# Patient Record
Sex: Male | Born: 1960 | Race: White | Hispanic: No | Marital: Married | Smoking: Never smoker
Health system: Southern US, Community
[De-identification: ages and names within clinical notes are randomized; demographics above are authoritative.]

## PROBLEM LIST (undated history)

## (undated) DIAGNOSIS — K219 Gastro-esophageal reflux disease without esophagitis: Secondary | ICD-10-CM

## (undated) DIAGNOSIS — F319 Bipolar disorder, unspecified: Secondary | ICD-10-CM

## (undated) DIAGNOSIS — I10 Essential (primary) hypertension: Secondary | ICD-10-CM

---

## 2013-02-15 DIAGNOSIS — K219 Gastro-esophageal reflux disease without esophagitis: Secondary | ICD-10-CM | POA: Insufficient documentation

## 2013-09-09 DIAGNOSIS — I10 Essential (primary) hypertension: Secondary | ICD-10-CM | POA: Insufficient documentation

## 2013-09-09 DIAGNOSIS — F3181 Bipolar II disorder: Secondary | ICD-10-CM | POA: Insufficient documentation

## 2013-12-14 DIAGNOSIS — J4540 Moderate persistent asthma, uncomplicated: Secondary | ICD-10-CM | POA: Insufficient documentation

## 2014-04-23 DIAGNOSIS — H1010 Acute atopic conjunctivitis, unspecified eye: Secondary | ICD-10-CM | POA: Insufficient documentation

## 2015-01-23 DIAGNOSIS — F419 Anxiety disorder, unspecified: Secondary | ICD-10-CM | POA: Insufficient documentation

## 2015-01-23 DIAGNOSIS — G43909 Migraine, unspecified, not intractable, without status migrainosus: Secondary | ICD-10-CM | POA: Insufficient documentation

## 2016-02-27 DIAGNOSIS — F4001 Agoraphobia with panic disorder: Secondary | ICD-10-CM | POA: Insufficient documentation

## 2016-02-27 DIAGNOSIS — F4010 Social phobia, unspecified: Secondary | ICD-10-CM | POA: Insufficient documentation

## 2017-07-22 DIAGNOSIS — D508 Other iron deficiency anemias: Secondary | ICD-10-CM | POA: Insufficient documentation

## 2019-02-07 DIAGNOSIS — R7303 Prediabetes: Secondary | ICD-10-CM | POA: Insufficient documentation

## 2019-11-29 DIAGNOSIS — R200 Anesthesia of skin: Secondary | ICD-10-CM | POA: Insufficient documentation

## 2019-11-29 DIAGNOSIS — M542 Cervicalgia: Secondary | ICD-10-CM | POA: Insufficient documentation

## 2019-12-31 ENCOUNTER — Other Ambulatory Visit: Payer: Self-pay

## 2019-12-31 ENCOUNTER — Emergency Department
Admission: EM | Admit: 2019-12-31 | Discharge: 2019-12-31 | Disposition: A | Discharge status: Home / Self Care | Payer: Medicare Other | Source: Home / Self Care | Attending: Family Medicine | Admitting: Family Medicine

## 2019-12-31 ENCOUNTER — Encounter: Payer: Self-pay | Admitting: Emergency Medicine

## 2019-12-31 DIAGNOSIS — R1032 Left lower quadrant pain: Secondary | ICD-10-CM | POA: Diagnosis not present

## 2019-12-31 DIAGNOSIS — K5900 Constipation, unspecified: Secondary | ICD-10-CM

## 2019-12-31 HISTORY — DX: Gastro-esophageal reflux disease without esophagitis: K21.9

## 2019-12-31 HISTORY — DX: Bipolar disorder, unspecified: F31.9

## 2019-12-31 LAB — POCT URINALYSIS DIP (MANUAL ENTRY)
Bilirubin, UA: NEGATIVE
Blood, UA: NEGATIVE
Glucose, UA: NEGATIVE mg/dL
Ketones, POC UA: NEGATIVE mg/dL
Leukocytes, UA: NEGATIVE
Nitrite, UA: NEGATIVE
Protein Ur, POC: NEGATIVE mg/dL
Spec Grav, UA: 1.01 (ref 1.010–1.025)
Urobilinogen, UA: 0.2 E.U./dL
pH, UA: 6.5 (ref 5.0–8.0)

## 2019-12-31 NOTE — Discharge Instructions (Addendum)
Recommend taking daily Miralax, beginning with 1/2 capful mixed in 4 to 8 ounces of water once daily.  If tolerated, increase to a whole capful of Miralax mixed with 8 ounces water.  Continue until bowel movements are regular. Recommend increasing natural fiber in diet.  May also take a daily fiber product such as Citrucel with plenty of fluid.   If symptoms become significantly worse during the night or over the weekend, proceed to the local emergency room.

## 2019-12-31 NOTE — ED Triage Notes (Signed)
Reports onset of flank pain 3 days ago that now is bilateral low back pain. Wonders if it is UTI versus muscular pain. Has had influenza vacc this season. No known exposure to covid positive person.

## 2019-12-31 NOTE — ED Provider Notes (Signed)
[facility]    CSN: [id number] Arrival date & time: 12/31/19  1549      History   Chief Complaint Chief Complaint  Patient presents with  . Back Pain    low    HPI Gary Park is a 59 y.o. male.   Patient complains of onset of lower back and flank pain 3 days ago.  He has had increased urinary frequency but no dysuria.  He then developed mild nausea without vomiting.  Today he developed vague colicky lower abdominal pain with cramping and increased gas.  He states that his last bowel movement two days ago was smaller than usual.  He denies fevers, chills, and sweats. He reports that he had a large colonic polyp removed in June or July last year.  A repeat colonoscopy was negative.  He has a family history of constipation (mother and brother).  The history is provided by the patient.    Past Medical History:  Diagnosis Date  . Bipolar 1 disorder ([place])   . GERD (gastroesophageal reflux disease)    Current problems and past surgical history reviewed:  No changes        Home Medications    Prior to Admission medications   Medication Sig Start Date End Date Taking? Authorizing Provider  ALPRAZolam Duanne Moron) 0.5 MG tablet Take 0.5 mg by mouth at bedtime as needed for anxiety.   Yes [provider]  aspirin 81 MG chewable tablet Chew by mouth daily.   Yes [provider]  buPROPion (WELLBUTRIN XL) 300 MG 24 hr tablet Take 450 mg by mouth daily.   Yes [provider]  lamoTRIgine (LAMICTAL) 100 MG tablet Take 100 mg by mouth daily.   Yes [provider]  pantoprazole (PROTONIX) 40 MG tablet Take 40 mg by mouth daily.   Yes [provider]  risperiDONE (RISPERDAL) 0.25 MG tablet Take 0.25 mg by mouth at bedtime.   Yes [provider]  traZODone (DESYREL) 50 MG tablet Take 50 mg by mouth at bedtime.   Yes [provider]    Family History Family history reviewed:  No changes.  Social History Social  History   Tobacco Use  . Smoking status: Never smoker  Substance Use Topics  . Alcohol use: Not currently  . Drug use: No     Allergies   Patient has no known allergies.   Review of Systems Review of Systems  Constitutional: Positive for activity change, appetite change and fatigue. Negative for chills, diaphoresis and fever.  HENT: Negative.   Eyes: Negative.   Respiratory: Negative.   Cardiovascular: Negative.   Gastrointestinal: Positive for abdominal pain, constipation and nausea. Negative for abdominal distention, anal bleeding, blood in stool, diarrhea and vomiting.  Genitourinary: Positive for frequency. Negative for dysuria, hematuria and urgency.  Musculoskeletal: Positive for back pain.  Skin: Negative for rash.     Physical Exam Triage Vital Signs ED Triage Vitals  Enc Vitals Group     BP 12/31/19 1632 119/83     Pulse Rate 12/31/19 1632 94     Resp 12/31/19 1632 16     Temp 12/31/19 1632 98.2 F (36.8 C)     Temp Source 12/31/19 1632 Oral     SpO2 12/31/19 1632 97 %     Weight 12/31/19 1634 280 lb (127 kg)     Height 12/31/19 1634 5' 9.5" (1.765 m)     Head Circumference --      Peak Flow --  Pain Score 12/31/19 1633 3     Pain Loc --      Pain Edu? --      Excl. in GC? --    No data found.  Updated Vital Signs BP 119/83 (BP Location: Right Arm)   Pulse 94   Temp 98.2 F (36.8 C) (Oral)   Resp 16   Ht 5' 9.5" (1.765 m)   Wt 127 kg   SpO2 97%   BMI 40.76 kg/m   Visual Acuity Right Eye Distance:   Left Eye Distance:   Bilateral Distance:    Right Eye Near:   Left Eye Near:    Bilateral Near:     Physical Exam Nursing notes and Vital Signs reviewed. Appearance:  Patient appears stated age, and in no acute distress.    Eyes:  Pupils are equal, round, and reactive to light and accomodation.  Extraocular movement is intact.  Conjunctivae are not inflamed   Pharynx:  Normal; moist mucous membranes  Neck:  Supple.  No  adenopathy Lungs:  Clear to auscultation.  Breath sounds are equal.  Moving air well. Heart:  Regular rate and rhythm without murmurs, rubs, or gallops.  Abdomen:  Vague tenderness over lower quadrants without masses or hepatosplenomegaly.  Bowel sounds are present.  No CVA or flank tenderness.  Extremities:  No edema.  Skin:  No rash present.     UC Treatments / Results  Labs (all labs ordered are listed, but only abnormal results are displayed) Labs Reviewed  POCT URINALYSIS DIP (MANUAL ENTRY) negative POCT CBC:  WBC 8.7 ; LY 40.7; MO 7.7; GR 51.6; Hgb 13.7; Platelets 196     EKG   Radiology DG Abd 1 View  Result Date: 01/01/2020 CLINICAL DATA:  Left lower quadrant abdominal pain, flank pain EXAM: ABDOMEN - 1 VIEW COMPARISON:  None. FINDINGS: The bowel gas pattern is nonobstructive. Moderate volume of stool projects throughout the colon. There is a rounded 12 mm radiodensity projecting over the inferior aspect of the right renal shadow, possibly reflecting nephrolithiasis, although this could also represent superimposed bowel contents given the degree of stool volume. No other significant radiographic abnormality are seen. IMPRESSION: Rounded 12 mm radiodensity projecting over the inferior aspect of the right renal shadow, possibly reflecting nephrolithiasis, although this could also represent superimposed bowel contents given the degree of stool volume. Consider further evaluation with renal ultrasound to assess for obstructive uropathy. Electronically Signed   By: Duanne Guess D.O.   On: 01/01/2020 09:11    Procedures Procedures (including critical care time)  Medications Ordered in UC Medications - No data to display  Initial Impression / Assessment and Plan / UC Course  I have reviewed the triage vital signs and the nursing notes.  Pertinent labs & imaging results that were available during my care of the patient were reviewed by me and considered in my medical decision  making (see chart for details).     Normal CBC and urinalysis reassuring.  Note KUB finding of moderate stool throughout the colon. Suspect constipation as cause of patient's pain. Recommend follow-up with PCP to schedule renal ultrasound to evaluate KUB finding of 47mm radiodensity over inferior aspect of right renal shadow.  Final Clinical Impressions(s) / UC Diagnoses   Final diagnoses:  Abdominal pain, left lower quadrant  Constipation, unspecified constipation type     Discharge Instructions     Recommend taking daily Miralax, beginning with 1/2 capful mixed in 4 to 8 ounces of water once  daily.  If tolerated, increase to a whole capful of Miralax mixed with 8 ounces water.  Continue until bowel movements are regular. Recommend increasing natural fiber in diet.  May also take a daily fiber product such as Citrucel with plenty of fluid.   If symptoms become significantly worse during the night or over the weekend, proceed to the local emergency room.     ED Prescriptions    None        Lattie Haw, MD 01/03/20 [phone]

## 2020-01-01 ENCOUNTER — Ambulatory Visit (INDEPENDENT_AMBULATORY_CARE_PROVIDER_SITE_OTHER): Payer: Medicare Other

## 2020-01-01 ENCOUNTER — Telehealth (INDEPENDENT_AMBULATORY_CARE_PROVIDER_SITE_OTHER): Payer: Medicare Other | Admitting: Family Medicine

## 2020-01-01 DIAGNOSIS — R1032 Left lower quadrant pain: Secondary | ICD-10-CM

## 2020-01-01 LAB — POCT CBC W AUTO DIFF (K'VILLE URGENT CARE)

## 2020-01-01 NOTE — Telephone Encounter (Signed)
Patient returns for follow-up; reports little change this morning. POCT CBC obtained:  WNL.

## 2020-06-19 ENCOUNTER — Encounter: Payer: Self-pay | Admitting: Emergency Medicine

## 2020-06-19 ENCOUNTER — Emergency Department (INDEPENDENT_AMBULATORY_CARE_PROVIDER_SITE_OTHER): Payer: Medicare Other

## 2020-06-19 ENCOUNTER — Other Ambulatory Visit: Payer: Self-pay

## 2020-06-19 ENCOUNTER — Emergency Department
Admission: EM | Admit: 2020-06-19 | Discharge: 2020-06-19 | Disposition: A | Discharge status: Home / Self Care | Payer: Medicare Other | Source: Home / Self Care | Attending: Family Medicine | Admitting: Family Medicine

## 2020-06-19 DIAGNOSIS — M654 Radial styloid tenosynovitis [de Quervain]: Secondary | ICD-10-CM | POA: Diagnosis not present

## 2020-06-19 MED ORDER — PREDNISONE 20 MG PO TABS: ORAL_TABLET | ORAL | 0 refills | Status: DC

## 2020-06-19 NOTE — ED Provider Notes (Signed)
[facility]    CSN: [id number] Arrival date & time: 06/19/20  1218      History   Chief Complaint Chief Complaint  Patient presents with  . Wrist Pain  . Wrist Injury    HPI Tayron Hunnell is a 59 y.o. male.   Patient was lifting furniture four days ago.  The next day he awoke with pain in his left wrist and thumb although he recalls no injury.   The history is provided by the patient.  Wrist Pain This is a new problem. Episode onset: 3 days ago. The problem occurs constantly. The problem has been gradually worsening. Exacerbated by: movement of left thumb. Nothing relieves the symptoms. Treatments tried: ibuprofen and ace wrap. The treatment provided mild relief.    Past Medical History:  Diagnosis Date  . Bipolar 1 disorder ([facility])   . GERD (gastroesophageal reflux disease)     Patient Active Problem List   Diagnosis Date Noted  . Left arm numbness 11/29/2019  . Neck pain 11/29/2019  . Prediabetes 02/07/2019  . Iron deficiency anemia secondary to inadequate dietary iron intake 07/22/2017  . 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 Social anxiety disorder 02/27/2016  . Anxiety 01/23/2015  . Migraine 01/23/2015  . Allergic conjunctivitis 04/23/2014  . Moderate persistent asthma, uncomplicated 12/14/2013  . Bipolar 2 disorder ([facility]) 09/09/2013  . HTN (hypertension) 09/09/2013  . GERD (gastroesophageal reflux disease) 02/15/2013    History reviewed. No pertinent surgical history.     Home Medications    Prior to Admission medications   Medication Sig Start Date End Date Taking? Authorizing Provider  ALPRAZolam Prudy Feeler) 0.5 MG tablet Take 0.5 mg by mouth at bedtime as needed for anxiety.   Yes [provider]  aspirin 81 MG chewable tablet Chew by mouth daily.   Yes [provider]  buPROPion (WELLBUTRIN XL) 300 MG 24 hr tablet Take 450 mg by mouth daily.   Yes [provider]  fluticasone (FLONASE) 50 MCG/ACT nasal spray  Place into the nose. 01/13/14  Yes [provider]  lamoTRIgine (LAMICTAL) 100 MG tablet Take 100 mg by mouth daily.   Yes [provider]  metoprolol succinate (TOPROL-XL) 50 MG 24 hr tablet Take by mouth. 05/07/20  Yes [provider]  Multiple Vitamin (MULTIVITAMIN) tablet Take by mouth.   Yes [provider]  Olopatadine HCl 0.2 % SOLN Apply 1 drop in each eye once daily as needed for eye symptoms. 01/13/14  Yes [provider]  pantoprazole (PROTONIX) 40 MG tablet Take 40 mg by mouth daily.   Yes [provider]  risperiDONE (RISPERDAL) 0.25 MG tablet Take 0.25 mg by mouth at bedtime.   Yes [provider]  traZODone (DESYREL) 50 MG tablet Take 50 mg by mouth at bedtime.   Yes [provider]  predniSONE (DELTASONE) 20 MG tablet Take one tab by mouth twice daily for 4 days, then one daily. Take with food. 06/19/20   Lattie Haw, MD    Family History History reviewed. No pertinent family history.  Social History Social History   Tobacco Use  . Smoking status: Never Smoker  . Smokeless tobacco: Never Used  Vaping Use  . Vaping Use: Never used  Substance Use Topics  . Alcohol use: Not on file  . Drug use: Not on file     Allergies   Bee pollen and Pollen extract   Review of Systems Review of Systems  Constitutional: Negative.  Musculoskeletal: Positive for joint swelling.       Left wrist/thumb pain     Physical Exam Triage Vital Signs ED Triage Vitals  Enc Vitals Group     BP 06/19/20 1254 138/90     Pulse Rate 06/19/20 1254 87     Resp 06/19/20 1254 18     Temp 06/19/20 1254 98.3 F (36.8 C)     Temp Source 06/19/20 1254 Oral     SpO2 06/19/20 1254 97 %     Weight 06/19/20 1257 300 lb (136.1 kg)     Height --      Head Circumference --      Peak Flow --      Pain Score 06/19/20 1256 4     Pain Loc --      Pain Edu? --      Excl. in GC? --    No data found.  Updated Vital  Signs BP 138/90 (BP Location: Right Arm)   Pulse 87   Temp 98.3 F (36.8 C) (Oral)   Resp 18   Wt 136.1 kg   SpO2 97%   BMI 43.67 kg/m   Visual Acuity Right Eye Distance:   Left Eye Distance:   Bilateral Distance:    Right Eye Near:   Left Eye Near:    Bilateral Near:     Physical Exam Vitals and nursing note reviewed.  Constitutional:      General: He is not in acute distress. Eyes:     Pupils: Pupils are equal, round, and reactive to light.  Cardiovascular:     Rate and Rhythm: Normal rate.  Pulmonary:     Effort: Pulmonary effort is normal.  Musculoskeletal:     Left wrist: Tenderness present. No swelling, bony tenderness, snuff box tenderness or crepitus. Normal range of motion. Normal pulse.       Hands:     Cervical back: Normal range of motion.     Comments: Left thumb/wrist dorsally have distinct tenderness over the extensor tendons.  Palpation there during resisted extension and abduction of the thumb recreates his pain.  Distal neurovascular function is intact.    Skin:    General: Skin is warm and dry.  Neurological:     Mental Status: He is alert.      UC Treatments / Results  Labs (all labs ordered are listed, but only abnormal results are displayed) Labs Reviewed - No data to display  EKG   Radiology DG Wrist Complete Left  Result Date: 06/19/2020 CLINICAL DATA:  Pain after moving heavy objects EXAM: LEFT WRIST - COMPLETE 3+ VIEW COMPARISON:  None. FINDINGS: Frontal, oblique, lateral, and ulnar deviation scaphoid images obtained. No fracture or dislocation. Joint spaces appear normal. No erosive change. IMPRESSION: No fracture or dislocation.  No evident arthropathy. Electronically Signed   By: Bretta Bang III M.D.   On: 06/19/2020 13:54    Procedures Procedures (including critical care time)  Medications Ordered in UC Medications - No data to display  Initial Impression / Assessment and Plan / UC Course  I have reviewed the triage  vital signs and the nursing notes.  Pertinent labs & imaging results that were available during my care of the patient were reviewed by me and considered in my medical decision making (see chart for details).    Thumb spica splint dispensed.  Begin prednisone burst/taper. Rx for Lortab #5, no refill  Controlled Substance Prescriptions I have consulted the [place] Controlled Substances Registry for  this patient, and feel the risk/benefit ratio today is favorable for proceeding with this prescription for a controlled substance.   Followup with Dr. Rodney [facility] ([facility]) if not improving about two weeks.  Final Clinical Impressions(s) / UC Diagnoses   Final diagnoses:  Tenosynovitis, de Quervain     Discharge Instructions     Wear wrist splint until improved. Apply ice pack for 20 to 30 minutes, 2 to 3 times daily  Continue until pain and swelling decrease.  May take Tylenol as needed for pain.  Begin range of motion and stretching exercises as tolerated.   ED Prescriptions    Medication Sig Dispense Auth. Provider   predniSONE (DELTASONE) 20 MG tablet Take one tab by mouth twice daily for 4 days, then one daily. Take with food. 12 tablet Lattie Haw, MD        Lattie Haw, MD 06/21/20 [phone]

## 2020-06-19 NOTE — ED Triage Notes (Addendum)
C/O pain to left wrist since Friday morning - pt woke  up w/ pain Swelling noted to L  thumb joint-limited ROM to thumb - swelling and bruising to left hand Pt has been using an ace wrap -provides min relief Ibuprofen OTC - none this am Pt is having difficulty gripping  Pt has been packing, getting ready to place home on the [employer] vaccine in April

## 2020-06-19 NOTE — Discharge Instructions (Addendum)
Wear wrist splint until improved. Apply ice pack for 20 to 30 minutes, 2 to 3 times daily  Continue until pain and swelling decrease.  May take Tylenol as needed for pain.  Begin range of motion and stretching exercises as tolerated.

## 2020-07-06 ENCOUNTER — Ambulatory Visit (INDEPENDENT_AMBULATORY_CARE_PROVIDER_SITE_OTHER): Payer: Medicare Other | Admitting: Sports Medicine

## 2020-07-06 ENCOUNTER — Other Ambulatory Visit: Payer: Self-pay

## 2020-07-06 DIAGNOSIS — M654 Radial styloid tenosynovitis [de Quervain]: Secondary | ICD-10-CM | POA: Diagnosis not present

## 2020-07-06 MED ORDER — TRIAZOLAM 0.25 MG PO TABS: ORAL_TABLET | ORAL | 0 refills | Status: DC

## 2020-07-06 NOTE — Progress Notes (Signed)
    Procedures performed today:    None.  Independent interpretation of notes and tests performed by another provider:   X-rays personally reviewed and are unremarkable.  Brief History, Exam, Impression, and Recommendations:    De Quervain's tenosynovitis, left Kazuo is a very pleasant 59 year old male with history of bipolar disorder and anxiety, he is currently trying to sell his house so has been doing a lot of moving, unfortunately he developed pain over the radial aspect of his left wrist, he was seen in urgent care and given prednisone, thumb spica brace, thumb spica brace helped to some degree but over the past 2 weeks he is continued to have severe pain. Today he has tenderness over the first extensor compartment with a strongly positive Finkelstein sign. The neck step is an injection, he is a bit nervous today so I am happy to call in some triazolam for him to take beforehand, we can do the shot next week.    ___________________________________________ Ihor Austin. Benjamin Stain, M.D., ABFM., CAQSM. Primary Care and Sports Medicine [place] [facility]  Adjunct Instructor of Family Medicine  [employer]

## 2020-07-06 NOTE — Assessment & Plan Note (Signed)
[facility] is a very pleasant 59 year old male with history of bipolar disorder and anxiety, he is currently trying to sell his house so has been doing a lot of moving, unfortunately he developed pain over the radial aspect of his left wrist, he was seen in urgent care and given prednisone, thumb spica brace, thumb spica brace helped to some degree but over the past 2 weeks he is continued to have severe pain. Today he has tenderness over the first extensor compartment with a strongly positive Finkelstein sign. The neck step is an injection, he is a bit nervous today so I am happy to call in some triazolam for him to take beforehand, we can do the shot next week.

## 2020-07-10 ENCOUNTER — Ambulatory Visit (INDEPENDENT_AMBULATORY_CARE_PROVIDER_SITE_OTHER): Payer: Medicare Other | Admitting: Sports Medicine

## 2020-07-10 DIAGNOSIS — M654 Radial styloid tenosynovitis [de Quervain]: Secondary | ICD-10-CM

## 2020-07-10 NOTE — Assessment & Plan Note (Signed)
Edwyn returns, we deferred his injection at the last visit due to needle anxiety. I gave him some triazolam and he feels fairly relaxed, today we performed a first extensor compartment injection with good relief immediately. Continue thumb spica brace for another week, and particularly when moving, otherwise activities as tolerated and may see me in a month.

## 2020-07-10 NOTE — Progress Notes (Signed)
    Procedures performed today:    Procedure: Real-time Ultrasound Guided injection of the left first extensor compartment Device: Samsung HS60  Verbal informed consent obtained.  Time-out conducted.  Noted no overlying erythema, induration, or other signs of local infection.  Skin prepped in a sterile fashion.  Local anesthesia: Topical Ethyl chloride.  With sterile technique and under real time ultrasound guidance: 1 cc Kenalog 40, 1 cc lidocaine, 1 cc bupivacaine injected easily Completed without difficulty  Pain immediately resolved suggesting accurate placement of the medication.  Advised to call if fevers/chills, erythema, induration, drainage, or persistent bleeding.  Images permanently stored and available for review in the ultrasound unit.  Impression: Technically successful ultrasound guided injection.  Independent interpretation of notes and tests performed by another provider:   None.  Brief History, Exam, Impression, and Recommendations:    De Quervain's tenosynovitis, left Puneet returns, we deferred his injection at the last visit due to needle anxiety. I gave him some triazolam and he feels fairly relaxed, today we performed a first extensor compartment injection with good relief immediately. Continue thumb spica brace for another week, and particularly when moving, otherwise activities as tolerated and may see me in a month.    ___________________________________________ Ihor Austin. Benjamin Stain, M.D., ABFM., CAQSM. Primary Care and Sports Medicine  [facility]  Adjunct Instructor of Family Medicine  [employer]

## 2020-08-03 ENCOUNTER — Ambulatory Visit: Payer: Medicare Other | Admitting: Sports Medicine

## 2020-11-24 ENCOUNTER — Emergency Department
Admission: EM | Admit: 2020-11-24 | Discharge: 2020-11-24 | Disposition: A | Discharge status: Home / Self Care | Payer: Medicare Other | Source: Home / Self Care

## 2020-11-24 ENCOUNTER — Other Ambulatory Visit: Payer: Self-pay

## 2020-11-24 DIAGNOSIS — H18822 Corneal disorder due to contact lens, left eye: Secondary | ICD-10-CM

## 2020-11-24 MED ORDER — GENTAMICIN SULFATE 0.3 % OP SOLN: 1.0000 [drp] | OPHTHALMIC | 0 refills | Status: AC

## 2020-11-24 NOTE — ED Triage Notes (Signed)
Pt c/o LT eye irritation x 2 days. Wears contacts daily, extended wear kind. Last time he removed his contact, it felt like it dried to his eye. Eye visibly red, watering, "sand" like feeling.

## 2020-11-24 NOTE — Discharge Instructions (Signed)
  Use the medication as prescribed. Avoid wearing your contacts until follow up with your eye specialist on Monday. Call Monday to schedule that appointment.    Call 911 or have someone drive you to the hospital if symptoms significantly worsening.

## 2020-11-24 NOTE — ED Provider Notes (Addendum)
[facility]    CSN: [id number] Arrival date & time: 11/24/20  1501      History   Chief Complaint Chief Complaint  Patient presents with  . eye irritation    LT    HPI Gary Park is a 59 y.o. male.   HPI  Gary Park is a 59 y.o. male presenting to UC with c/o Left eye irritation that started 2 days ago while wearing extended wear contacts. He changed to a new pair of contacts after leaving the old ones out for several hours. He does sleep in his contacts and has continued to feel irritation. He removed the new contacts earlier today and still has irritation described as "sand" in his eye. His family encouraged him to be evaluated.  Denies fever or chills but has had nasal congestion and mild headache.  States he has bifocals but does not like wearing them as he has not been able to "get use to them."   No sick contacts.    Past Medical History:  Diagnosis Date  . Bipolar 1 disorder (HCC)   . GERD (gastroesophageal reflux disease)     Patient Active Problem List   Diagnosis Date Noted  . De Quervain's tenosynovitis, left 07/06/2020  . Left arm numbness 11/29/2019  . Neck pain 11/29/2019  . Prediabetes 02/07/2019  . Iron deficiency anemia secondary to inadequate dietary iron intake 07/22/2017  . 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 Social anxiety disorder 02/27/2016  . Anxiety 01/23/2015  . Migraine 01/23/2015  . Allergic conjunctivitis 04/23/2014  . Moderate persistent asthma, uncomplicated 12/14/2013  . Bipolar 2 disorder (HCC) 09/09/2013  . HTN (hypertension) 09/09/2013  . GERD (gastroesophageal reflux disease) 02/15/2013    History reviewed. No pertinent surgical history.     Home Medications    Prior to Admission medications   Medication Sig Start Date End Date Taking? Authorizing Provider  ALPRAZolam Prudy Feeler) 0.5 MG tablet Take 0.5 mg by mouth at bedtime as needed for anxiety.    [provider]  aspirin 81 MG chewable  tablet Chew by mouth daily.    [provider]  buPROPion (WELLBUTRIN XL) 300 MG 24 hr tablet Take 450 mg by mouth daily.    [provider]  fluticasone (FLONASE) 50 MCG/ACT nasal spray Place into the nose. 01/13/14   [provider]  gentamicin (GARAMYCIN) 0.3 % ophthalmic solution Place 1 drop into the left eye every 4 (four) hours for 5 days. 11/24/20 11/29/20  Lurene Shadow, PA-C  lamoTRIgine (LAMICTAL) 100 MG tablet Take 100 mg by mouth daily.    [provider]  metoprolol succinate (TOPROL-XL) 50 MG 24 hr tablet Take by mouth. 05/07/20   [provider]  Multiple Vitamin (MULTIVITAMIN) tablet Take by mouth.    [provider]  Olopatadine HCl 0.2 % SOLN Apply 1 drop in each eye once daily as needed for eye symptoms. 01/13/14   [provider]  pantoprazole (PROTONIX) 40 MG tablet Take 40 mg by mouth daily.    [provider]  predniSONE (DELTASONE) 20 MG tablet Take one tab by mouth twice daily for 4 days, then one daily. Take with food. 06/19/20   Lattie Haw, MD  risperiDONE (RISPERDAL) 0.25 MG tablet Take 0.25 mg by mouth at bedtime.    [provider]  traZODone (DESYREL) 50 MG tablet Take 50 mg by mouth at bedtime.    [provider]  triazolam (HALCION) 0.25 MG tablet 1-2 tabs  PO 2 hours before procedure or imaging.  Do not drive with this medication. 07/06/20   Monica Becton, MD    Family History History reviewed. No pertinent family history.  Social History Social History   Tobacco Use  . Smoking status: Never Smoker  . Smokeless tobacco: Never Used  Vaping Use  . Vaping Use: Never used     Allergies   Bee pollen and Pollen extract   Review of Systems Review of Systems  Constitutional: Negative for chills and fever.  HENT: Positive for congestion. Negative for ear pain, rhinorrhea, sinus pressure, sinus pain, sneezing, sore throat and tinnitus.   Eyes: Positive for  pain, redness, itching and visual disturbance (unable to wear his contacts). Negative for photophobia.  Respiratory: Negative for cough.   Neurological: Positive for headaches. Negative for dizziness and light-headedness.     Physical Exam Triage Vital Signs ED Triage Vitals  Enc Vitals Group     BP 11/24/20 1511 (!) 142/86     Pulse Rate 11/24/20 1511 93     Resp 11/24/20 1511 17     Temp 11/24/20 1511 98 F (36.7 C)     Temp Source 11/24/20 1511 Oral     SpO2 11/24/20 1511 99 %     Weight --      Height --      Head Circumference --      Peak Flow --      Pain Score 11/24/20 1514 6     Pain Loc --      Pain Edu? --      Excl. in GC? --    No data found.  Updated Vital Signs BP (!) 142/86 (BP Location: Right Arm)   Pulse 93   Temp 98 F (36.7 C) (Oral)   Resp 17   SpO2 99%   Visual Acuity- without correction in Left eye (injured eye), Contact in Right eye (good eye) Right Eye Distance: 20/30 Left Eye Distance: 20/70 Bilateral Distance: 20/30  Right Eye Near:   Left Eye Near:    Bilateral Near:     Physical Exam Vitals and nursing note reviewed.  Constitutional:      General: He is not in acute distress.    Appearance: Normal appearance. He is well-developed and well-nourished. He is obese. He is not ill-appearing, toxic-appearing or diaphoretic.  HENT:     Head: Normocephalic and atraumatic.     Right Ear: Tympanic membrane and ear canal normal.     Left Ear: Tympanic membrane and ear canal normal.     Nose: Nose normal.     Right Sinus: No maxillary sinus tenderness or frontal sinus tenderness.     Left Sinus: No maxillary sinus tenderness or frontal sinus tenderness.     Mouth/Throat:     Lips: Pink.     Mouth: Mucous membranes are moist.     Pharynx: Oropharynx is clear. Uvula midline.  Eyes:     General: Lids are normal. Lids are everted, no foreign bodies appreciated. Vision grossly intact. Gaze aligned appropriately.        Left eye: No foreign  body or discharge.     Extraocular Movements: Extraocular movements intact and EOM normal.     Conjunctiva/sclera:     Right eye: Right conjunctiva is not injected.     Left eye: Left conjunctiva is injected.   Cardiovascular:     Rate and Rhythm: Normal rate and regular rhythm.  Pulmonary:     Effort:  Pulmonary effort is normal. No respiratory distress.     Breath sounds: Normal breath sounds.  Musculoskeletal:        General: Normal range of motion.     Cervical back: Normal range of motion and neck supple. No rigidity or tenderness.  Lymphadenopathy:     Cervical: No cervical adenopathy.  Skin:    General: Skin is warm and dry.  Neurological:     Mental Status: He is alert and oriented to person, place, and time.  Psychiatric:        Mood and Affect: Mood and affect normal.        Behavior: Behavior normal.      UC Treatments / Results  Labs (all labs ordered are listed, but only abnormal results are displayed) Labs Reviewed - No data to display  EKG   Radiology No results found.  Procedures Procedures (including critical care time)  Medications Ordered in UC Medications - No data to display  Initial Impression / Assessment and Plan / UC Course  I have reviewed the triage vital signs and the nursing notes.  Pertinent labs & imaging results that were available during my care of the patient were reviewed by me and considered in my medical decision making (see chart for details).    Hx and exam c/w corneal abrasion of Left eye from contact lenses Encouraged f/u with his eye specialist Monday for recheck of symptoms AVS given  Final Clinical Impressions(s) / UC Diagnoses   Final diagnoses:  Corneal abrasion due to contact lens, left     Discharge Instructions      Use the medication as prescribed. Avoid wearing your contacts until follow up with your eye specialist on Monday. Call Monday to schedule that appointment.    Call 911 or have someone drive  you to the hospital if symptoms significantly worsening.     ED Prescriptions    Medication Sig Dispense Auth. Provider   gentamicin (GARAMYCIN) 0.3 % ophthalmic solution Place 1 drop into the left eye every 4 (four) hours for 5 days. 5 mL Lurene Shadow, PA-C     PDMP not reviewed this encounter.   Lurene Shadow, PA-C 11/24/20 1601    Lurene Shadow, PA-C 11/24/20 1701

## 2021-03-07 ENCOUNTER — Other Ambulatory Visit: Payer: Self-pay

## 2021-03-07 ENCOUNTER — Emergency Department
Admission: EM | Admit: 2021-03-07 | Discharge: 2021-03-07 | Disposition: A | Discharge status: Home / Self Care | Payer: Medicare Other | Source: Home / Self Care

## 2021-03-07 ENCOUNTER — Encounter: Payer: Self-pay | Admitting: Emergency Medicine

## 2021-03-07 ENCOUNTER — Telehealth: Payer: Self-pay | Admitting: Emergency Medicine

## 2021-03-07 DIAGNOSIS — R519 Headache, unspecified: Secondary | ICD-10-CM

## 2021-03-07 DIAGNOSIS — R110 Nausea: Secondary | ICD-10-CM | POA: Diagnosis not present

## 2021-03-07 DIAGNOSIS — H9202 Otalgia, left ear: Secondary | ICD-10-CM

## 2021-03-07 DIAGNOSIS — B349 Viral infection, unspecified: Secondary | ICD-10-CM | POA: Diagnosis not present

## 2021-03-07 DIAGNOSIS — R5383 Other fatigue: Secondary | ICD-10-CM | POA: Diagnosis not present

## 2021-03-07 DIAGNOSIS — L03811 Cellulitis of head [any part, except face]: Secondary | ICD-10-CM

## 2021-03-07 MED ORDER — ACETAMINOPHEN 325 MG PO TABS
650.0000 mg | ORAL_TABLET | Freq: Once | ORAL | Status: AC
  Administered 2021-03-07: 650 mg via ORAL

## 2021-03-07 MED ORDER — AMOXICILLIN-POT CLAVULANATE 875-125 MG PO TABS: 1.0000 | ORAL_TABLET | Freq: Two times a day (BID) | ORAL | 0 refills | Status: AC

## 2021-03-07 MED ORDER — ONDANSETRON HCL 4 MG PO TABS: 4.0000 mg | ORAL_TABLET | Freq: Four times a day (QID) | ORAL | 0 refills | Status: AC

## 2021-03-07 NOTE — ED Triage Notes (Signed)
body aches for a few days  Increased fatigue  Nausea - thinks it may be related to diabetes med  Small red area on back of neck On cpap- compliant COVID booster 09/2020

## 2021-03-07 NOTE — ED Provider Notes (Signed)
[place]   [id number] 03/07/21 Arrival Time: 8270   CC: COVID symptoms  SUBJECTIVE: History from: patient.  [facility] Park is a 60 y.o. male who presents with body aches, left otalgia, fatigue, chills, nausea for the last few days. Recently increased Ozempic and attributes nausea to this change. Reports blood sugars at home have been 110-120s in the mornings and 90s at night. Reports blood sugar this morning was in the 150s. Denies sick exposure to COVID, flu or strep. Denies recent travel. Has negative history of Covid. Has completed Covid vaccines and booster. Has completed flu vaccine this season. Has not taken OTC medications for this. Reports fatigue is worse with activity. Denies previous symptoms in the past. Denies fever, sinus pain, rhinorrhea, sore throat, SOB, wheezing, chest pain, nausea, changes in bowel or bladder habits. Has had recent intentional weight loss of 20 pounds. He is being followed by [facility] with [facility] in [place].  ROS: As per HPI.  All other pertinent ROS negative.     Past Medical History:  Diagnosis Date  . Bipolar 1 disorder ([place])   . GERD (gastroesophageal reflux disease)    History reviewed. No pertinent surgical history. Allergies  Allergen Reactions  . Bee Pollen     Other reaction(s): Other Stuffy nose  . Pollen Extract     Other reaction(s): Other Stuffy nose Stuffy nose Other reaction(s): Other (See Comments) Stuffy nose    No current facility-administered medications on file prior to encounter.   Current Outpatient Medications on File Prior to Encounter  Medication Sig Dispense Refill  . Blood Glucose Monitoring Suppl (ONETOUCH VERIO) w/Device KIT Use to check blood sugar as directed.    Marland Kitchen glucose blood (ONETOUCH VERIO) test strip Use to check blood sugar before breakfast and supper daily. FOR ONE TOUCH VERIO    . montelukast (SINGULAIR) 10 MG tablet Take by mouth.    . ALPRAZolam (XANAX) 0.5 MG tablet Take 0.5  mg by mouth at bedtime as needed for anxiety.    Marland Kitchen aspirin 81 MG chewable tablet Chew by mouth daily.    Marland Kitchen buPROPion (WELLBUTRIN XL) 300 MG 24 hr tablet Take 450 mg by mouth daily.    . cetirizine (ZYRTEC) 10 MG tablet Take by mouth.    . fluticasone (FLONASE) 50 MCG/ACT nasal spray Place into the nose.    . lamoTRIgine (LAMICTAL) 100 MG tablet Take 100 mg by mouth daily.    . metoprolol succinate (TOPROL-XL) 50 MG 24 hr tablet Take by mouth.    . Multiple Vitamin (MULTIVITAMIN) tablet Take by mouth.    . Olopatadine HCl 0.2 % SOLN Apply 1 drop in each eye once daily as needed for eye symptoms.    Marland Kitchen OZEMPIC, 0.25 OR 0.5 MG/DOSE, 2 MG/1.5ML SOPN INJECT 0.38 MLS (0.5 MG DOSE) INTO THE SKIN ONCE A WEEK FOR 28 DAYS.    Marland Kitchen pantoprazole (PROTONIX) 40 MG tablet Take 40 mg by mouth daily.    . predniSONE (DELTASONE) 20 MG tablet Take one tab by mouth twice daily for 4 days, then one daily. Take with food. (Patient not taking: Reported on 03/07/2021) 12 tablet 0  . risperiDONE (RISPERDAL) 0.25 MG tablet Take 0.25 mg by mouth at bedtime.    . traZODone (DESYREL) 50 MG tablet Take 50 mg by mouth at bedtime.    . triazolam (HALCION) 0.25 MG tablet 1-2 tabs PO 2 hours before procedure or imaging.  Do not drive with this medication. 2 tablet 0  Social History   Socioeconomic History  . Marital status: Married    Spouse name: Not on file  . Number of children: Not on file  . Years of education: Not on file  . Highest education level: Not on file  Occupational History  . Not on file  Tobacco Use  . Smoking status: Never Smoker  . Smokeless tobacco: Never Used  Vaping Use  . Vaping Use: Never used  Substance and Sexual Activity  . Alcohol use: Not Currently  . Drug use: Not Currently  . Sexual activity: Not on file  Other Topics Concern  . Not on file  Social History Narrative  . Not on file   Social Determinants of Health   Financial Resource Strain: Not on file  Food Insecurity: Not on  file  Transportation Needs: Not on file  Physical Activity: Not on file  Stress: Not on file  Social Connections: Not on file  Intimate Partner Violence: Not on file   Family History  Problem Relation Age of Onset  . Healthy Mother   . Atrial fibrillation Father   . Dementia Father     OBJECTIVE:  Vitals:   03/07/21 1348 03/07/21 1354  BP: 129/85   Pulse: 94   Resp: 17   Temp: 98.9 F (37.2 C)   TempSrc: Oral   SpO2: 96%   Weight:  295 lb (133.8 kg)  Height:  5' 9"  (1.753 m)     General appearance: alert; appears fatigued, but nontoxic; speaking in full sentences and tolerating own secretions HEENT: NCAT; Ears: EACs clear, R TM pearly gray, L TM erythematous, no bulging, clear effusion; Eyes: PERRL.  EOM grossly intact. Sinuses: nontender; Nose: nares patent with clear rhinorrhea, Throat: oropharynx erythematous, cobblestoning present, tonsils non erythematous or enlarged, uvula midline  Neck: supple with L LAD Lungs: unlabored respirations, symmetrical air entry; cough: absent; no respiratory distress; CTAB Heart: regular rate and rhythm.  Radial pulses 2+ symmetrical bilaterally Skin: warm and dry, 2cm diameter area of erythema, swelling, tenderness warmth at posterior L neck, consistent with cellulitis Psychological: alert and cooperative; normal mood and affect  LABS:  No results found for this or any previous visit (from the past 24 hour(s)).   ASSESSMENT & PLAN:  1. Viral illness   2. Other fatigue   3. Nonintractable headache, unspecified chronicity pattern, unspecified headache type   4. Nausea without vomiting   5. Acute otalgia, left   6. Cellulitis of head except face     Meds ordered this encounter  Medications  . acetaminophen (TYLENOL) tablet 650 mg  . ondansetron (ZOFRAN) 4 MG tablet    Sig: Take 1 tablet (4 mg total) by mouth every 6 (six) hours.    Dispense:  12 tablet    Refill:  0    Order Specific Question:   Supervising Provider     Answer:   Chase Picket [id number]  . amoxicillin-clavulanate (AUGMENTIN) 875-125 MG tablet    Sig: Take 1 tablet by mouth 2 (two) times daily for 10 days.    Dispense:  20 tablet    Refill:  0    Order Specific Question:   Supervising Provider    Answer:   Chase Picket [id number]    Tylenol given in office for headache Prescribed Augmentin 875 mg BID x 7 days Prescribed Zofran for nausea prn Continue to monitor blood sugars at home and eat regular meals Continue supportive care at home COVID and flu testing ordered.  It will take between 2-3 days for test results. Someone will contact you regarding abnormal results.   Work note provided Patient should remain in quarantine until they have received Covid results.  If negative you may resume normal activities (go back to work/school) while practicing hand hygiene, social distance, and mask wearing.  If positive, patient should remain in quarantine for at least 5 days from symptom onset AND greater than 72 hours after symptoms resolution (absence of fever without the use of fever-reducing medication and improvement in respiratory symptoms), whichever is longer Get plenty of rest and push fluids Use OTC zyrtec for nasal congestion, runny nose, and/or sore throat Use OTC flonase for nasal congestion and runny nose Use medications daily for symptom relief Use OTC medications like ibuprofen or tylenol as needed fever or pain Call or go to the ED if you have any new or worsening symptoms such as fever, worsening cough, shortness of breath, chest tightness, chest pain, turning blue, changes in mental status.  Reviewed expectations re: course of current medical issues. Questions answered. Outlined signs and symptoms indicating need for more acute intervention. Patient verbalized understanding. After Visit Summary given.         Faustino Congress, NP 03/07/21 1436

## 2021-03-07 NOTE — Discharge Instructions (Addendum)
I have sent in Augmentin for you to take twice a day for 7 days.  I have sent in Zofran for you to take one tablet every 8 hours as needed for nausea.  Your COVID test is pending.  You should self quarantine until the test result is back.    Take Tylenol or ibuprofen as needed for fever or discomfort.  Rest and keep yourself hydrated.    Follow-up with your primary care provider if your symptoms are not improving.

## 2021-03-07 NOTE — Telephone Encounter (Signed)
Call back to pharmacy regarding prior authorization for zofran 4mg  PO  q 6 hr prn nausea. Unknown why PA was required, promethazine may be substituted per pharmacist.Reviewed w/ provider who prescribed meds. No changes at this time due to possible interactions with current med list for pt.

## 2021-03-09 LAB — COVID-19, FLU A+B NAA
Influenza A, NAA: NOT DETECTED
Influenza B, NAA: NOT DETECTED
SARS-CoV-2, NAA: NOT DETECTED

## 2022-01-02 ENCOUNTER — Other Ambulatory Visit: Payer: Self-pay

## 2022-01-02 ENCOUNTER — Emergency Department (INDEPENDENT_AMBULATORY_CARE_PROVIDER_SITE_OTHER): Payer: Medicare Other

## 2022-01-02 ENCOUNTER — Emergency Department
Admission: EM | Admit: 2022-01-02 | Discharge: 2022-01-02 | Disposition: A | Discharge status: Home / Self Care | Payer: Medicare Other | Source: Home / Self Care

## 2022-01-02 DIAGNOSIS — R0602 Shortness of breath: Secondary | ICD-10-CM | POA: Diagnosis not present

## 2022-01-02 DIAGNOSIS — R079 Chest pain, unspecified: Secondary | ICD-10-CM

## 2022-01-02 HISTORY — DX: Essential (primary) hypertension: I10

## 2022-01-02 NOTE — ED Triage Notes (Signed)
Pt presents to Urgent Care with c/o intermittent sharp chest pain x approx 8 days. Reports the pain occasionally radiates to his back--between shoulder blades. States he has had intermittent sob and dizziness; reports jaw pain when the first episode began over one week ago.

## 2022-01-02 NOTE — Discharge Instructions (Addendum)
Advised patient if symptoms worsen and/or unresolved lease follow-up with PCP for cardiology consult with stress test.  Advised patient if symptoms (chest pain/shortness of breath/jaw pain) are accompanied by dizziness lightheadedness and/or diaphoresis/sweating please go to nearest ED for immediate evaluation.

## 2022-01-02 NOTE — ED Provider Notes (Signed)
[facility]    CSN: [id number] Arrival date & time: 01/02/22  1441      History   Chief Complaint Chief Complaint  Patient presents with   Chest Pain    HPI Gary Park is a 61 y.o. male.   HPI Pleasant 61 year old male presents with intermittent sharp chest pain x 8 days.  Patient reports chest pain will occasionally radiate to his back between his shoulder blades.  Additionally, patient reports intermittent shortness of breath, dizziness and reports jaw pain when first episode began over a week ago.  PMH significant for OSA, HTN, left arm numbness, and moderate persistent asthma.  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 No pertinent surgical history.     Home Medications    Prior to Admission medications   Medication Sig Start Date End Date Taking? Authorizing Provider  ALPRAZolam Duanne Moron) 0.5 MG tablet Take 0.5 mg by mouth daily. 1 mg daily in evening & prn    [provider]  aspirin 81 MG chewable tablet Chew 162 mg by mouth daily.    [provider]  Blood Glucose Monitoring Suppl (ONETOUCH VERIO) w/Device KIT Use to check blood sugar as directed. 01/02/21   [provider]  buPROPion (WELLBUTRIN XL) 300 MG 24 hr tablet Take  450 mg by mouth daily.    [provider]  cetirizine (ZYRTEC) 10 MG tablet Take by mouth.    [provider]  fluticasone (FLONASE) 50 MCG/ACT nasal spray Place into the nose. 01/13/14   [provider]  glucose blood (ONETOUCH VERIO) test strip Use to check blood sugar before breakfast and supper daily. FOR ONE TOUCH VERIO 02/26/21 02/26/22  [provider]  lamoTRIgine (LAMICTAL) 100 MG tablet Take 100 mg by mouth daily.    [provider]  metoprolol succinate (TOPROL-XL) 50 MG 24 hr tablet Take by mouth. 1/2 tab 05/07/20   [provider]  montelukast (SINGULAIR) 10 MG tablet Take by mouth. 02/19/21   [provider]  Multiple Vitamin (MULTIVITAMIN) tablet Take by mouth.    [provider]  Olopatadine HCl 0.2 % SOLN Apply 1 drop in each eye once daily as needed for eye symptoms. 01/13/14   [provider]  ondansetron (ZOFRAN) 4 MG tablet Take 1 tablet (4 mg total) by mouth every 6 (six) hours. 03/07/21   Faustino Congress, NP  OZEMPIC, 0.25 OR 0.5 MG/DOSE, 2 MG/1.5ML SOPN INJECT 0.38 MLS (0.5 MG DOSE) INTO THE SKIN ONCE A WEEK FOR 28 DAYS. 01/17/21   [provider]  pantoprazole (PROTONIX) 40 MG tablet Take 40 mg by mouth daily.    [provider]  predniSONE (DELTASONE) 20 MG tablet Take one tab by mouth twice daily for 4 days, then one daily. Take with food. Patient not  taking: Reported on 03/07/2021 06/19/20   Kandra Nicolas, MD  risperiDONE (RISPERDAL) 0.25 MG tablet Take 0.25 mg by mouth at bedtime.    [provider]  traZODone (DESYREL) 50 MG tablet Take 50 mg by mouth at bedtime.    [provider]  triazolam (HALCION) 0.25 MG tablet 1-2 tabs PO 2 hours before procedure or imaging.  Do not drive with this medication. 07/06/20   Silverio Decamp, MD    Family History Family History  Problem Relation Age of Onset   Healthy Mother    Atrial fibrillation Father     Dementia Father     Social History Social History   Tobacco Use   Smoking status: Never   Smokeless tobacco: Never  Vaping Use   Vaping Use: Never used  Substance Use Topics   Alcohol use: Not Currently   Drug use: Not Currently     Allergies   Bee pollen and Pollen extract   Review of Systems Review of Systems  Respiratory:  Positive for shortness of breath.   Cardiovascular:  Positive for chest pain.    Physical Exam Triage Vital Signs ED Triage Vitals  Enc Vitals Group     BP 01/02/22 1507 140/83     Pulse Rate 01/02/22 1507 90     Resp 01/02/22 1507 20     Temp 01/02/22 1523 98.4 F (36.9 C)     Temp src --      SpO2 01/02/22 1507 97 %     Weight --      Height --      Head Circumference --      Peak Flow --      Pain Score --      Pain Loc --      Pain Edu? --      Excl. in [place]? --    No data found.  Updated Vital Signs BP 121/84 (BP Location: Left Arm)    Pulse 90    Temp 98.4 F (36.9 C)    Resp 20    SpO2 97%      Physical Exam Vitals and nursing note reviewed.  Constitutional:      General: He is not in acute distress.    Appearance: He is well-developed. He is obese. He is not ill-appearing, toxic-appearing or diaphoretic.  HENT:     Head: Normocephalic and atraumatic.  Eyes:     Extraocular Movements: Extraocular movements intact.     Pupils: Pupils are equal, round, and reactive to light.  Neck:     Comments: No JVD no bruit Cardiovascular:     Rate and Rhythm: Normal rate and regular rhythm.     Pulses:          Carotid pulses are 1+ on the right side and 1+ on the left side.      Radial pulses are 2+ on the right side and 2+ on the left side.       Posterior tibial pulses are 2+ on the right side and 2+ on the left side.     Heart sounds: Normal heart sounds.  Pulmonary:     Effort: Pulmonary effort is normal.     Breath sounds: Normal breath sounds.  Musculoskeletal:     Cervical back: Normal range of motion and neck supple.   Skin:    General: Skin is warm and dry.  Neurological:     General: No focal deficit present.     Mental  Status: He is alert and oriented to person, place, and time.     UC Treatments / Results  Labs (all labs ordered are listed, but only abnormal results are displayed) Labs Reviewed - No data to display  EKG   Radiology DG Chest 2 View  Result Date: 01/02/2022 CLINICAL DATA:  Right-sided chest pain for 1 week, short of breath EXAM: CHEST - 2 VIEW COMPARISON:  None. FINDINGS: The heart size and mediastinal contours are within normal limits. Both lungs are clear. The visualized skeletal structures are unremarkable. IMPRESSION: No active cardiopulmonary disease. Electronically Signed   By: Randa Ngo M.D.   On: 01/02/2022 15:40    Procedures Procedures (including critical care time)  Medications Ordered in UC Medications - No data to display  Initial Impression / Assessment and Plan / UC Course  I have reviewed the triage vital signs and the nursing notes.  Pertinent labs & imaging results that were available during my care of the patient were reviewed by me and considered in my medical decision making (see chart for details).     MDM: 1.  Chest pain, unspecified type-EKG revealed NSR cardiac examination was unremarkable today; 2.  Shortness of breath-CXR negative for acute cardiopulmonary process. Advised patient if symptoms worsen and/or unresolved lease follow-up with PCP for cardiology consult with stress test.  Advised patient if symptoms (chest pain/shortness of breath/jaw pain) are accompanied by dizziness lightheadedness and/or diaphoresis/sweating please go to nearest ED for immediate evaluation.  Patient discharged home, hemodynamically stable. Final Clinical Impressions(s) / UC Diagnoses   Final diagnoses:  Chest pain, unspecified type  Shortness of breath     Discharge Instructions      Advised patient if symptoms worsen and/or unresolved lease follow-up  with PCP for cardiology consult with stress test.  Advised patient if symptoms (chest pain/shortness of breath/jaw pain) are accompanied by dizziness lightheadedness and/or diaphoresis/sweating please go to nearest ED for immediate evaluation.     ED Prescriptions   None    PDMP not reviewed this encounter.   Eliezer Lofts, [place] 01/02/22 [phone]

## 2022-03-12 ENCOUNTER — Ambulatory Visit: Payer: Medicare Other | Admitting: Sports Medicine

## 2022-03-28 ENCOUNTER — Ambulatory Visit (INDEPENDENT_AMBULATORY_CARE_PROVIDER_SITE_OTHER): Payer: Medicare Other | Admitting: Sports Medicine

## 2022-03-28 DIAGNOSIS — M7661 Achilles tendinitis, right leg: Secondary | ICD-10-CM | POA: Insufficient documentation

## 2022-03-28 DIAGNOSIS — M654 Radial styloid tenosynovitis [de Quervain]: Secondary | ICD-10-CM

## 2022-03-28 MED ORDER — MELOXICAM 15 MG PO TABS: ORAL_TABLET | ORAL | 3 refills | Status: AC

## 2022-03-28 MED ORDER — TRIAZOLAM 0.25 MG PO TABS
ORAL_TABLET | ORAL | 0 refills | Status: AC
Start: 2022-03-28 — End: ?

## 2022-03-28 NOTE — Assessment & Plan Note (Signed)
[place] also did a lot of lawnmowing, afterwards he had severe pain right heel at the Achilles insertion. ?On exam he has tenderness posteriorly at the Achilles insertion, no tenderness at the retrocalcaneal bursa, plantar fascia, or tibialis posterior.  Meloxicam should help, adding Achilles conditioning, bilateral heel lifts, return to see me as needed for this. ?

## 2022-03-28 NOTE — Assessment & Plan Note (Signed)
This is a very pleasant 61 year old male, he has a history of de Quervain's tendinitis, left wrist, we injected him about 2 years ago and he did really well, he does need triazolam prior to injections. ?He is having a recurrence of symptoms, pain at the first extensor compartment with a positive Finkelstein sign. ?No pain at the [facility]. ?We will add meloxicam, home conditioning, triazolam and he can come back to see me sometime next week for the injection if needed still. ?

## 2022-03-28 NOTE — Progress Notes (Signed)
? ? ?  Procedures performed today:   ? ?None. ? ?Independent interpretation of notes and tests performed by another provider:  ? ?None. ? ?Brief History, Exam, Impression, and Recommendations:   ? ?De Quervain's tenosynovitis, left ?This is a very pleasant 61 year old male, he has a history of de Quervain's tendinitis, left wrist, we injected him about 2 years ago and he did really well, he does need triazolam prior to injections. ?He is having a recurrence of symptoms, pain at the first extensor compartment with a positive Finkelstein sign. ?No pain at the [facility]. ?We will add meloxicam, home conditioning, triazolam and he can come back to see me sometime next week for the injection if needed still. ? 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 ?On exam he has tenderness posteriorly at the Achilles insertion, no tenderness at the retrocalcaneal bursa, plantar fascia, or tibialis posterior.  Meloxicam should help, adding Achilles conditioning, bilateral heel lifts, return to see me as needed for this. ? ?Chronic process with exacerbation and pharmacologic intervention ? ?___________________________________________ ?Gwen Her. Dianah Field, M.D., ABFM., CAQSM. ?Primary Care and Sports Medicine ?[place] ? ?Adjunct Instructor of Family Medicine  ?[employer] ?

## 2022-04-09 ENCOUNTER — Ambulatory Visit (INDEPENDENT_AMBULATORY_CARE_PROVIDER_SITE_OTHER): Payer: Medicare Other | Admitting: Sports Medicine

## 2022-04-09 ENCOUNTER — Ambulatory Visit (INDEPENDENT_AMBULATORY_CARE_PROVIDER_SITE_OTHER): Payer: Medicare Other

## 2022-04-09 DIAGNOSIS — M654 Radial styloid tenosynovitis [de Quervain]: Secondary | ICD-10-CM

## 2022-04-09 NOTE — Progress Notes (Signed)
? ? ?  Procedures performed today:   ? ?Procedure: Real-time Ultrasound Guided injection of the left first extensor compartment ?Device: Samsung HS60  ?Verbal informed consent obtained.  ?Time-out conducted.  ?Noted no overlying erythema, induration, or other signs of local infection.  ?Skin prepped in a sterile fashion.  ?Local anesthesia: Topical Ethyl chloride.  ?With sterile technique and under real time ultrasound guidance: Noted extensor compartment effusion, 1 cc Kenalog 40, 1 cc lidocaine, 1 cc bupivacaine injected easily ?Completed without difficulty  ?Advised to call if fevers/chills, erythema, induration, drainage, or persistent bleeding.  ?Images permanently stored and available for review in PACS.  ?Impression: Technically successful ultrasound guided injection. ? ?Independent interpretation of notes and tests performed by another provider:  ? ?None. ? ?Brief History, Exam, Impression, and Recommendations:   ? ?De Quervain's tenosynovitis, left ?This is a very pleasant 62 year old male, history of de Quervain's tendinitis, left wrist, he was injected about 2 years ago and did really well, not having recurrence of symptoms, no pain at the [facility], positive Finkelstein sign, today he has triazolam on board so we we did a first extensor compartment injection, return to see me in a month. ? ? ? ?___________________________________________ ?Gwen Her. Dianah Field, M.D., ABFM., CAQSM. ?Primary Care and Sports Medicine ?[place] ? ?Adjunct Instructor of Family Medicine  ?[employer] ?

## 2022-04-09 NOTE — Assessment & Plan Note (Addendum)
This is a very pleasant 61 year old male, history of de Quervain's tendinitis, left wrist, he was injected about 2 years ago and did really well, not having recurrence of symptoms, no pain at the [facility], positive Finkelstein sign, today he has triazolam on board so we we did a first extensor compartment injection, return to see me in a month. ?

## 2022-05-09 ENCOUNTER — Ambulatory Visit: Payer: Medicare Other | Admitting: Sports Medicine

## 2022-07-26 IMAGING — DX DG CHEST 2V
2 series · 2 of 2 positions shown · non-contrast
Comparison: None.

CLINICAL DATA: Right-sided chest pain for 1 week, short of breath

EXAM:
CHEST - 2 VIEW

[chest pa]
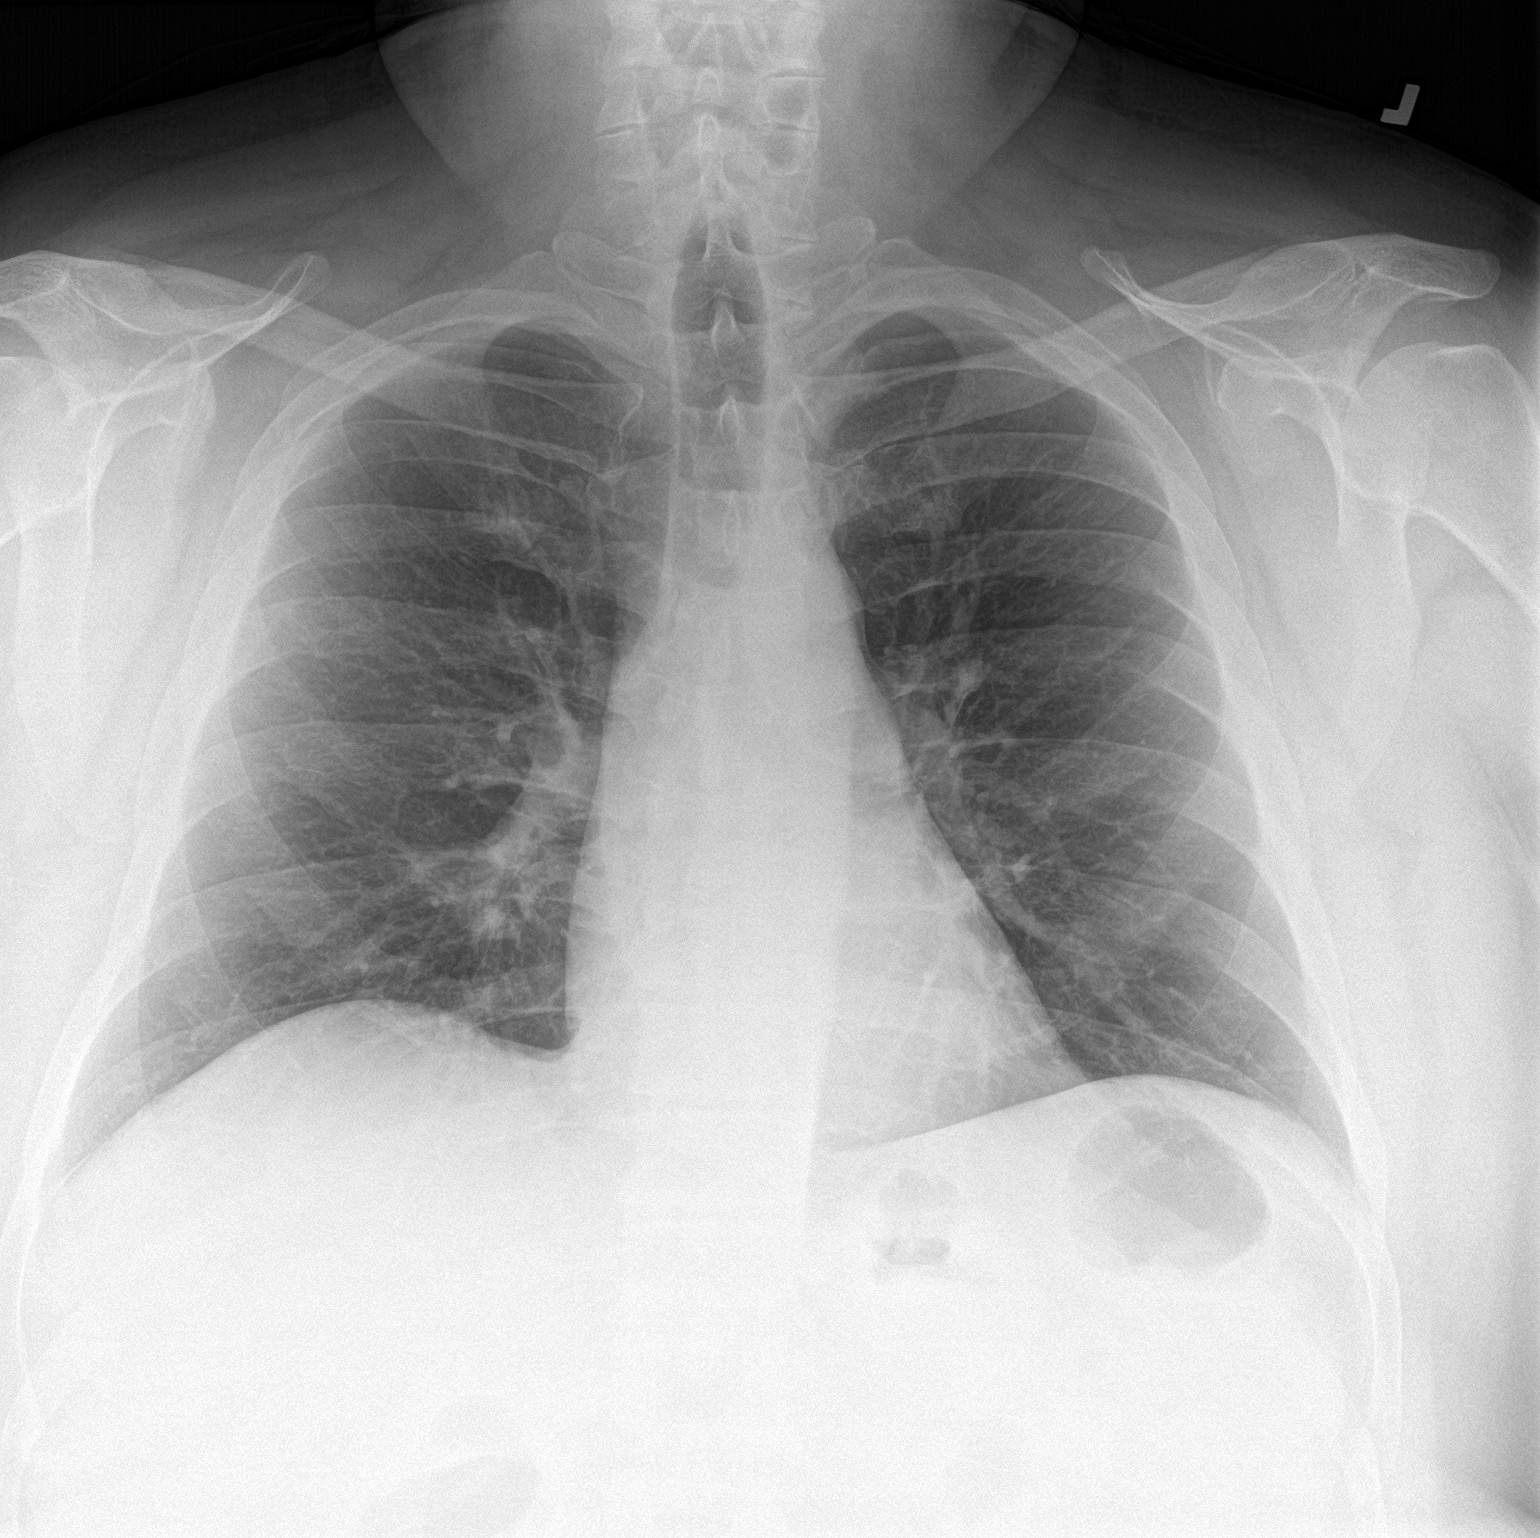

[chest lat]
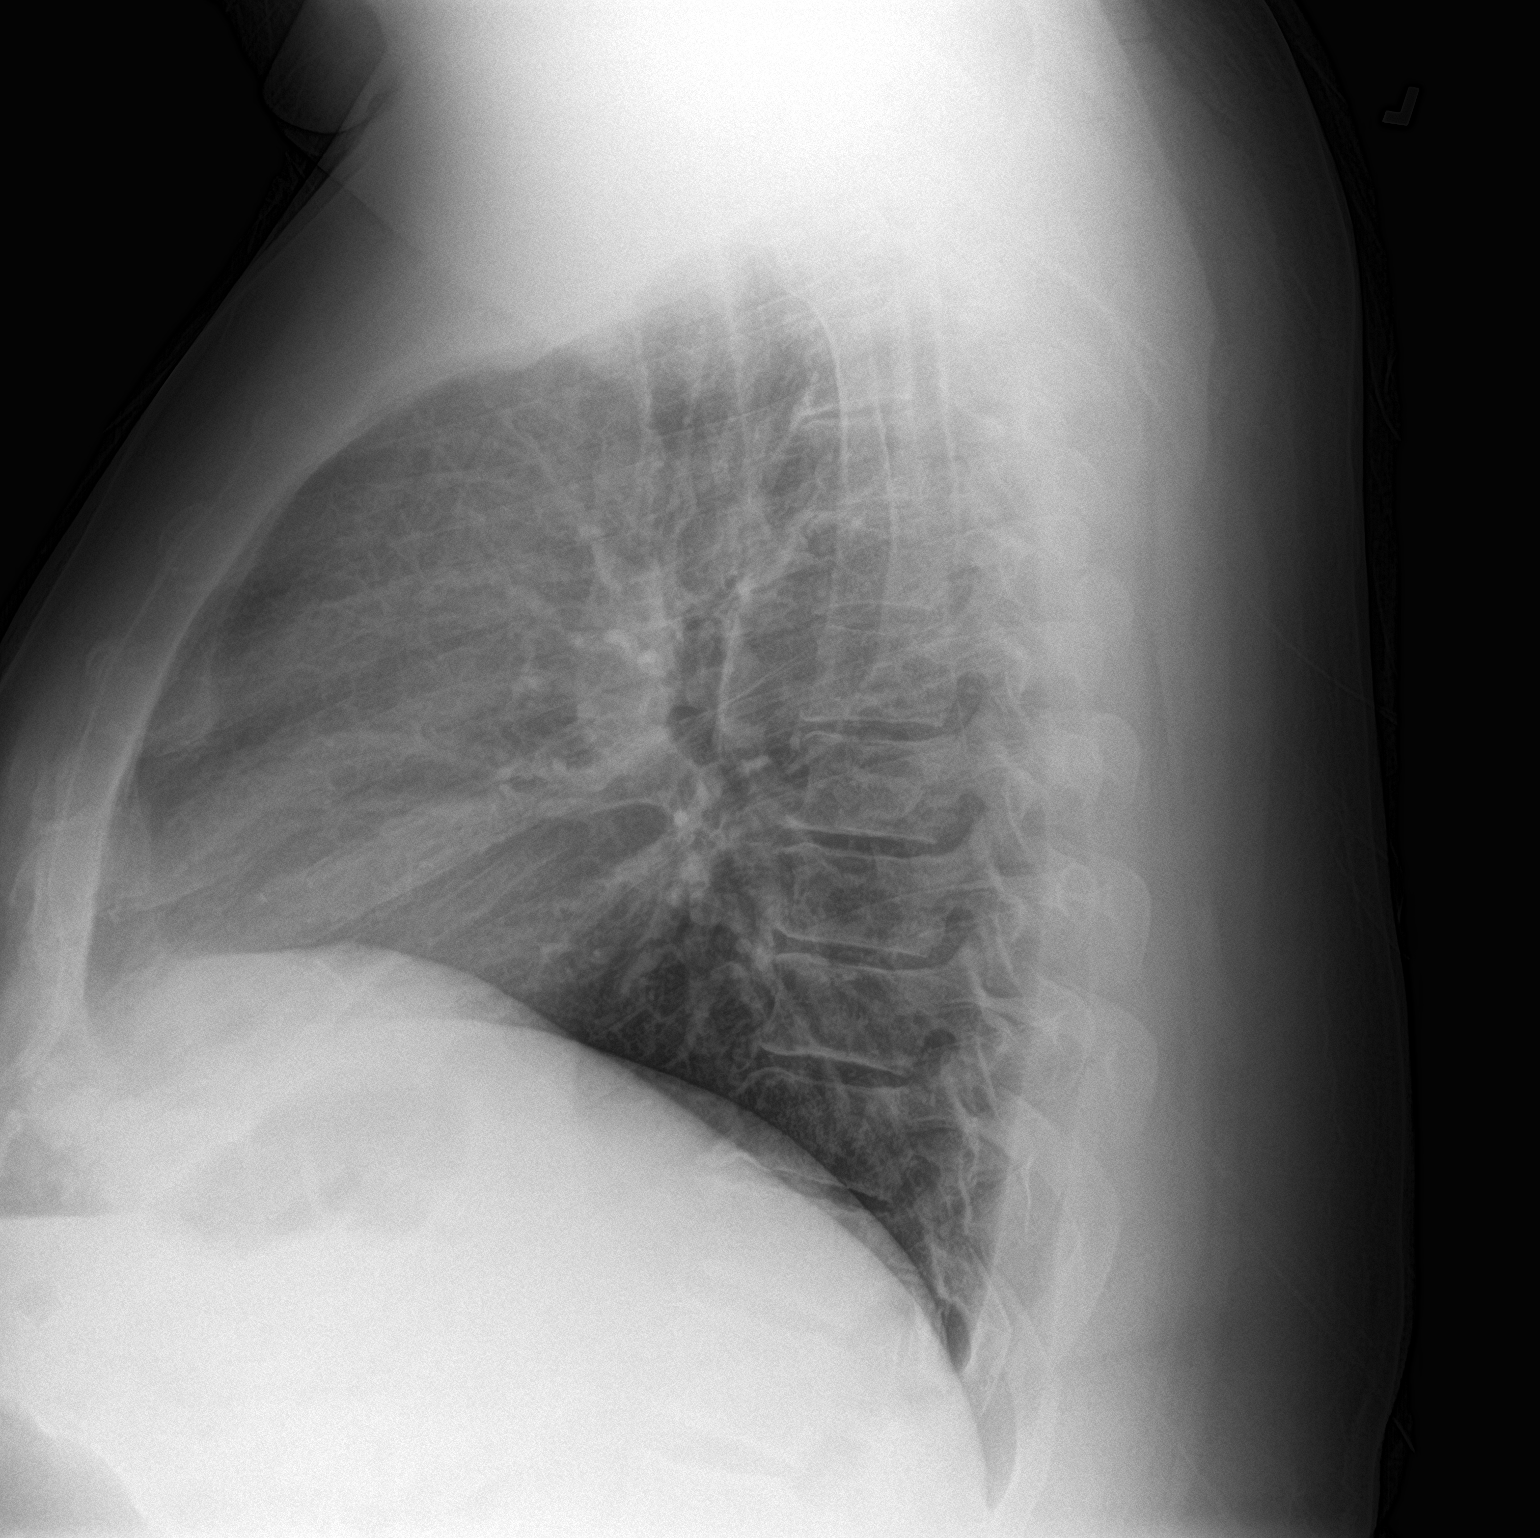

[2 of 2 positions shown; findings below may reference images not displayed]

FINDINGS: The heart size and mediastinal contours are within normal limits.
Both lungs are clear. The visualized skeletal structures are
unremarkable.
IMPRESSION: No active cardiopulmonary disease.

## 2024-08-16 ENCOUNTER — Encounter: Payer: Self-pay | Admitting: Sports Medicine
